# Patient Record
Sex: Male | Born: 1965 | Hispanic: No | Marital: Married
Health system: Southern US, Community
[De-identification: ages and names within clinical notes are randomized; demographics above are authoritative.]

---

## 2000-07-22 ENCOUNTER — Encounter: Payer: Self-pay | Admitting: Emergency Medicine

## 2000-07-22 ENCOUNTER — Emergency Department (HOSPITAL_COMMUNITY): Admission: EM | Admit: 2000-07-22 | Discharge: 2000-07-22 | Payer: Self-pay | Admitting: *Deleted

## 2010-02-23 ENCOUNTER — Emergency Department (HOSPITAL_COMMUNITY): Admission: EM | Admit: 2010-02-23 | Discharge: 2010-02-23 | Payer: Self-pay | Admitting: Emergency Medicine

## 2010-02-26 ENCOUNTER — Emergency Department (HOSPITAL_COMMUNITY): Admission: EM | Admit: 2010-02-26 | Discharge: 2010-02-26 | Payer: Self-pay | Admitting: Internal Medicine

## 2020-01-22 ENCOUNTER — Emergency Department (HOSPITAL_COMMUNITY): Payer: HRSA Program

## 2020-01-22 ENCOUNTER — Emergency Department (HOSPITAL_COMMUNITY)
Admission: EM | Admit: 2020-01-22 | Discharge: 2020-01-23 | Disposition: A | Discharge status: Home / Self Care | Payer: HRSA Program | Attending: Emergency Medicine | Admitting: Emergency Medicine

## 2020-01-22 ENCOUNTER — Encounter (HOSPITAL_COMMUNITY): Payer: Self-pay | Admitting: Emergency Medicine

## 2020-01-22 ENCOUNTER — Other Ambulatory Visit: Payer: Self-pay

## 2020-01-22 DIAGNOSIS — R519 Headache, unspecified: Secondary | ICD-10-CM | POA: Diagnosis present

## 2020-01-22 DIAGNOSIS — U071 COVID-19: Secondary | ICD-10-CM | POA: Diagnosis not present

## 2020-01-22 MED ORDER — ALBUTEROL SULFATE HFA 108 (90 BASE) MCG/ACT IN AERS
2.0000 | INHALATION_SPRAY | RESPIRATORY_TRACT | Status: DC | PRN
  Administered 2020-01-22: 2 via RESPIRATORY_TRACT
  Filled 2020-01-22: qty 6.7

## 2020-01-22 MED ORDER — AEROCHAMBER Z-STAT PLUS/MEDIUM MISC
1.0000 | Freq: Once | Status: AC
  Administered 2020-01-22: 1
  Filled 2020-01-22: qty 1

## 2020-01-22 MED ORDER — ACETAMINOPHEN 325 MG PO TABS
650.0000 mg | ORAL_TABLET | Freq: Once | ORAL | Status: AC | PRN
  Administered 2020-01-22: 650 mg via ORAL
  Filled 2020-01-22: qty 2

## 2020-01-22 NOTE — ED Provider Notes (Signed)
[facility]-EMERGENCY DEPT Provider Note   CSN: [id number] Arrival date & time: 01/22/20  1831     History Chief Complaint  Patient presents with  . Headache  . Cough    [facility] is a 54 y.o. male with a hx of no major medical problems presents to the Emergency Department complaining of gradual, persistent, progressively worsening headache and cough onset 6 days ago.  Pt reports his headache has improved spontaneously over the last few days, but his cough has worsened.  Pt reports no known sick or COVID contacts.  Pt reports he is not vaccinated.  Pt found to be febrile on triage and acetaminophen given at that time. Pt reports a hx of intermittent headaches for the last 4 years.  On Monday he was evaluated at [facility] for his headache and given Nurtec.  Pt reports this did not help his symptoms.  No other treatments PTA.  No specific aggravating or alleviating factors. Pt denies neck pain, neck stiffness, chest pain, abd pain, N/V/D, weakness, dizziness, syncope, dysuria.     The history is provided by the patient and medical records. No language interpreter was used.       History reviewed. No pertinent past medical history.  There are no problems to display for this patient.   History reviewed. No pertinent surgical history.     No family history on file.  Social History   Tobacco Use  . Smoking status: Not on file  Substance Use Topics  . Alcohol use: Not on file  . Drug use: Not on file    Home Medications Prior to Admission medications   Medication Sig Start Date End Date Taking? Authorizing Provider  albuterol (VENTOLIN HFA) 108 (90 Base) MCG/ACT inhaler Inhale 2 puffs into the lungs every 2 (two) hours as needed for wheezing or shortness of breath (cough). 01/23/20   Timouthy Gilardi, Dahlia Client, PA-C  Spacer/Aero-Holding Chambers (AEROCHAMBER PLUS WITH MASK) inhaler Use as instructed 01/23/20   Reann Dobias, Dahlia Client, PA-C    Allergies      Patient has no known allergies.  Review of Systems   Review of Systems  Constitutional: Positive for fatigue and fever. Negative for appetite change, diaphoresis and unexpected weight change.  HENT: Negative for mouth sores.   Eyes: Negative for visual disturbance.  Respiratory: Positive for cough and shortness of breath. Negative for chest tightness and wheezing.   Cardiovascular: Negative for chest pain.  Gastrointestinal: Negative for abdominal pain, constipation, diarrhea, nausea and vomiting.  Endocrine: Negative for polydipsia, polyphagia and polyuria.  Genitourinary: Negative for dysuria, frequency, hematuria and urgency.  Musculoskeletal: Positive for myalgias. Negative for back pain and neck stiffness.  Skin: Negative for rash.  Allergic/Immunologic: Negative for immunocompromised state.  Neurological: Positive for headaches. Negative for syncope and light-headedness.  Hematological: Does not bruise/bleed easily.  Psychiatric/Behavioral: Negative for sleep disturbance. The patient is not nervous/anxious.     Physical Exam Updated Vital Signs BP (!) 132/98 (BP Location: Left Arm)   Pulse (!) 116   Temp (!) 101.1 F (38.4 C) (Oral)   Resp 18   Ht 6\' 2"  (1.88 m)   Wt 95.3 kg   SpO2 96%   BMI 26.96 kg/m   Physical Exam Vitals and nursing note reviewed.  Constitutional:      General: He is not in acute distress.    Appearance: He is not diaphoretic.  HENT:     Head: Normocephalic.  Eyes:     General: No scleral icterus.  Conjunctiva/sclera: Conjunctivae normal.  Cardiovascular:     Rate and Rhythm: Regular rhythm. Tachycardia present.     Pulses: Normal pulses.          Radial pulses are 2+ on the right side and 2+ on the left side.     Comments: Mild tachycardia Pulmonary:     Effort: No tachypnea, accessory muscle usage, prolonged expiration, respiratory distress or retractions.     Breath sounds: No stridor. Wheezing ( throughout) and rhonchi present.      Comments: Equal chest rise. No increased work of breathing. Abdominal:     General: There is no distension.     Palpations: Abdomen is soft.     Tenderness: There is no abdominal tenderness. There is no guarding or rebound.  Musculoskeletal:     Cervical back: Normal range of motion.     Comments: Moves all extremities equally and without difficulty.  Skin:    General: Skin is warm and dry.     Capillary Refill: Capillary refill takes less than 2 seconds.  Neurological:     Mental Status: He is alert.     GCS: GCS eye subscore is 4. GCS verbal subscore is 5. GCS motor subscore is 6.     Comments: Speech is clear and goal oriented.  Psychiatric:        Mood and Affect: Mood normal.     ED Results / Procedures / Treatments   Labs (all labs ordered are listed, but only abnormal results are displayed) Labs Reviewed  SARS CORONAVIRUS 2 BY RT PCR (HOSPITAL ORDER, PERFORMED IN [facility]) - Abnormal; Notable for the following components:      Result Value   SARS Coronavirus 2 POSITIVE (*)    All other components within normal limits     Radiology DG Chest 2 View  Result Date: 01/22/2020 CLINICAL DATA:  Cough and headache for 4 days. EXAM: CHEST - 2 VIEW COMPARISON:  None. FINDINGS: The cardiomediastinal silhouette is unremarkable. Hazy/mild airspace opacity within the LOWER lobe on the LATERAL view may represent pneumonia. There is no evidence of pulmonary edema, suspicious pulmonary nodule/mass, pleural effusion, or pneumothorax. No acute bony abnormalities are identified. IMPRESSION: Hazy/mild airspace opacity within the LOWER lobe on the LATERAL view, question pneumonia. Radiographic follow-up to resolution is recommended. Electronically Signed   By: Harmon Pier M.D.   On: 01/22/2020 19:56    Procedures Procedures (including critical care time)  Medications Ordered in ED Medications  albuterol (VENTOLIN HFA) 108 (90 Base) MCG/ACT inhaler 2 puff (2 puffs Inhalation  Given 01/22/20 2319)  acetaminophen (TYLENOL) tablet 650 mg (650 mg Oral Given 01/22/20 1920)  aerochamber Z-Stat Plus/medium 1 each (1 each Other Given 01/22/20 2319)    ED Course  I have reviewed the triage vital signs and the nursing notes.  Pertinent labs & imaging results that were available during my care of the patient were reviewed by me and considered in my medical decision making (see chart for details).  Clinical Course as of Jan 22 446  Wynelle Link Jan 22, 2020  2221 Pt given Nurtec by Urgent Care on Monday but has not has relief.   [HM]    Clinical Course User Index [HM] Aquarius Latouche, Boyd Kerbs   MDM Rules/Calculators/A&P                           [facility] was evaluated in Emergency Department on 01/23/2020 for the symptoms described  in the history of present illness. He was evaluated in the context of the global COVID-19 pandemic, which necessitated consideration that the patient might be at risk for infection with the SARS-CoV-2 virus that causes COVID-19. Institutional protocols and algorithms that pertain to the evaluation of patients at risk for COVID-19 are in a state of rapid change based on information released by regulatory bodies including the CDC and federal and state organizations. These policies and algorithms were followed during the patient's care in the ED.  Pt presents with febrile illness, cough and headache.  Concerning for COVID-19.  No resp distress, hypoxia or tachypnea.  COVID test pending.  Some wheezing on exam, albuterol given here.  Patient Covid positive.  Improved wheezing after albuterol administration.  Patient remains without hypoxia or tachypnea.  Will be discharged home.  Patient was added to the monoclonal antibody list to see if he qualifies.  BP (!) 128/100 (BP Location: Right Arm)   Pulse 88   Temp 98.9 F (37.2 C) (Oral)   Resp 20   Ht 6\' 2"  (1.88 m)   Wt 95.3 kg   SpO2 98%   BMI 26.96 kg/m    Final Clinical Impression(s) /  ED Diagnoses Final diagnoses:  COVID-19    Rx / DC Orders ED Discharge Orders         Ordered    albuterol (VENTOLIN HFA) 108 (90 Base) MCG/ACT inhaler  Every 2 hours PRN        01/23/20 0230    Spacer/Aero-Holding Chambers (AEROCHAMBER PLUS WITH MASK) inhaler        01/23/20 0230           Rhea Thrun, 01/25/20, PA-C 01/23/20 01/25/20    [id number], MD 01/24/20 1248

## 2020-01-22 NOTE — ED Triage Notes (Signed)
Pt coughing and headache x 4 days. Pt also had congestion. Denies being exposed to Covid.

## 2020-01-23 ENCOUNTER — Telehealth: Payer: Self-pay | Admitting: Unknown Physician Specialty

## 2020-01-23 ENCOUNTER — Ambulatory Visit (HOSPITAL_COMMUNITY)
Admission: RE | Admit: 2020-01-23 | Discharge: 2020-01-23 | Disposition: A | Discharge status: Home / Self Care | Payer: HRSA Program | Source: Ambulatory Visit | Attending: Pulmonary Disease | Admitting: Pulmonary Disease

## 2020-01-23 ENCOUNTER — Other Ambulatory Visit: Payer: Self-pay | Admitting: Unknown Physician Specialty

## 2020-01-23 DIAGNOSIS — E663 Overweight: Secondary | ICD-10-CM

## 2020-01-23 DIAGNOSIS — U071 COVID-19: Secondary | ICD-10-CM | POA: Insufficient documentation

## 2020-01-23 LAB — SARS CORONAVIRUS 2 BY RT PCR (HOSPITAL ORDER, PERFORMED IN ~~LOC~~ HOSPITAL LAB): SARS Coronavirus 2: POSITIVE — AB

## 2020-01-23 MED ORDER — SODIUM CHLORIDE 0.9 % IV SOLN: INTRAVENOUS | Status: DC | PRN

## 2020-01-23 MED ORDER — METHYLPREDNISOLONE SODIUM SUCC 125 MG IJ SOLR: 125.0000 mg | Freq: Once | INTRAMUSCULAR | Status: DC | PRN

## 2020-01-23 MED ORDER — ALBUTEROL SULFATE HFA 108 (90 BASE) MCG/ACT IN AERS: 2.0000 | INHALATION_SPRAY | Freq: Once | RESPIRATORY_TRACT | Status: DC | PRN

## 2020-01-23 MED ORDER — DIPHENHYDRAMINE HCL 50 MG/ML IJ SOLN: 50.0000 mg | Freq: Once | INTRAMUSCULAR | Status: DC | PRN

## 2020-01-23 MED ORDER — AEROCHAMBER PLUS FLO-VU MISC: 2 refills | Status: AC

## 2020-01-23 MED ORDER — FAMOTIDINE IN NACL 20-0.9 MG/50ML-% IV SOLN: 20.0000 mg | Freq: Once | INTRAVENOUS | Status: DC | PRN

## 2020-01-23 MED ORDER — ALBUTEROL SULFATE HFA 108 (90 BASE) MCG/ACT IN AERS: 2.0000 | INHALATION_SPRAY | RESPIRATORY_TRACT | 0 refills | Status: AC | PRN

## 2020-01-23 MED ORDER — SODIUM CHLORIDE 0.9 % IV SOLN
1200.0000 mg | Freq: Once | INTRAVENOUS | Status: AC
  Administered 2020-01-23: 1200 mg via INTRAVENOUS
  Filled 2020-01-23: qty 10

## 2020-01-23 MED ORDER — EPINEPHRINE 0.3 MG/0.3ML IJ SOAJ: 0.3000 mg | Freq: Once | INTRAMUSCULAR | Status: DC | PRN

## 2020-01-23 NOTE — Discharge Instructions (Signed)

## 2020-01-23 NOTE — Telephone Encounter (Signed)
I connected by phone with [facility] Jacobs on 01/23/2020 at 8:33 AM to discuss the potential use of a new treatment for mild to moderate COVID-19 viral infection in non-hospitalized patients.  This patient is a 54 y.o. male that meets the FDA criteria for Emergency Use Authorization of COVID monoclonal antibody casirivimab/imdevimab.  Has a (+) direct SARS-CoV-2 viral test result  Has mild or moderate COVID-19   Is NOT hospitalized due to COVID-19  Is within 10 days of symptom onset  Has at least one of the high risk factor(s) for progression to severe COVID-19 and/or hospitalization as defined in EUA.  Specific high risk criteria : BMI > 25   I have spoken and communicated the following to the patient or parent/caregiver regarding COVID monoclonal antibody treatment:  1. FDA has authorized the emergency use for the treatment of mild to moderate COVID-19 in adults and pediatric patients with positive results of direct SARS-CoV-2 viral testing who are 9 years of age and older weighing at least 40 kg, and who are at high risk for progressing to severe COVID-19 and/or hospitalization.  2. The significant known and potential risks and benefits of COVID monoclonal antibody, and the extent to which such potential risks and benefits are unknown.  3. Information on available alternative treatments and the risks and benefits of those alternatives, including clinical trials.  4. Patients treated with COVID monoclonal antibody should continue to self-isolate and use infection control measures (e.g., wear mask, isolate, social distance, avoid sharing personal items, clean and disinfect "high touch" surfaces, and frequent handwashing) according to CDC guidelines.   5. The patient or parent/caregiver has the option to accept or refuse COVID monoclonal antibody treatment.  After reviewing this information with the patient, The patient agreed to proceed with receiving casirivimab\imdevimab infusion and  will be provided a copy of the Fact sheet prior to receiving the infusion. Gabriel Cirri 01/23/2020 8:33 AM Sx onset 8/17

## 2020-01-23 NOTE — Progress Notes (Signed)
  Diagnosis: COVID-19  Physician:DR Delford Field  Procedure: [facility] Infusion [facility] Med: casirivimab\imdevimab infusion - Provided patient with casirivimab\imdevimab fact sheet for patients, parents and caregivers prior to infusion.  Complications: No immediate complications noted.  Discharge: Discharged home   Audrie Gallus 01/23/2020

## 2020-01-23 NOTE — Discharge Instructions (Signed)
1. Medications: Alternate tylenol and ibuprofen for fever control, continue usual home medications 2. Treatment: rest, drink plenty of fluids, isolate for the next 10 days 3. Follow Up: Please followup with your primary doctor if your symptoms are not improving after 10-14 days; Please return to the ER for high fevers, persistent vomiting, shortness of breath or other concerns.  

## 2021-03-24 IMAGING — CR DG CHEST 2V
2 series · 2 of 2 positions shown · non-contrast
Comparison: None.

CLINICAL DATA: Cough and headache for 4 days.

EXAM:
CHEST - 2 VIEW

[w chest pa]
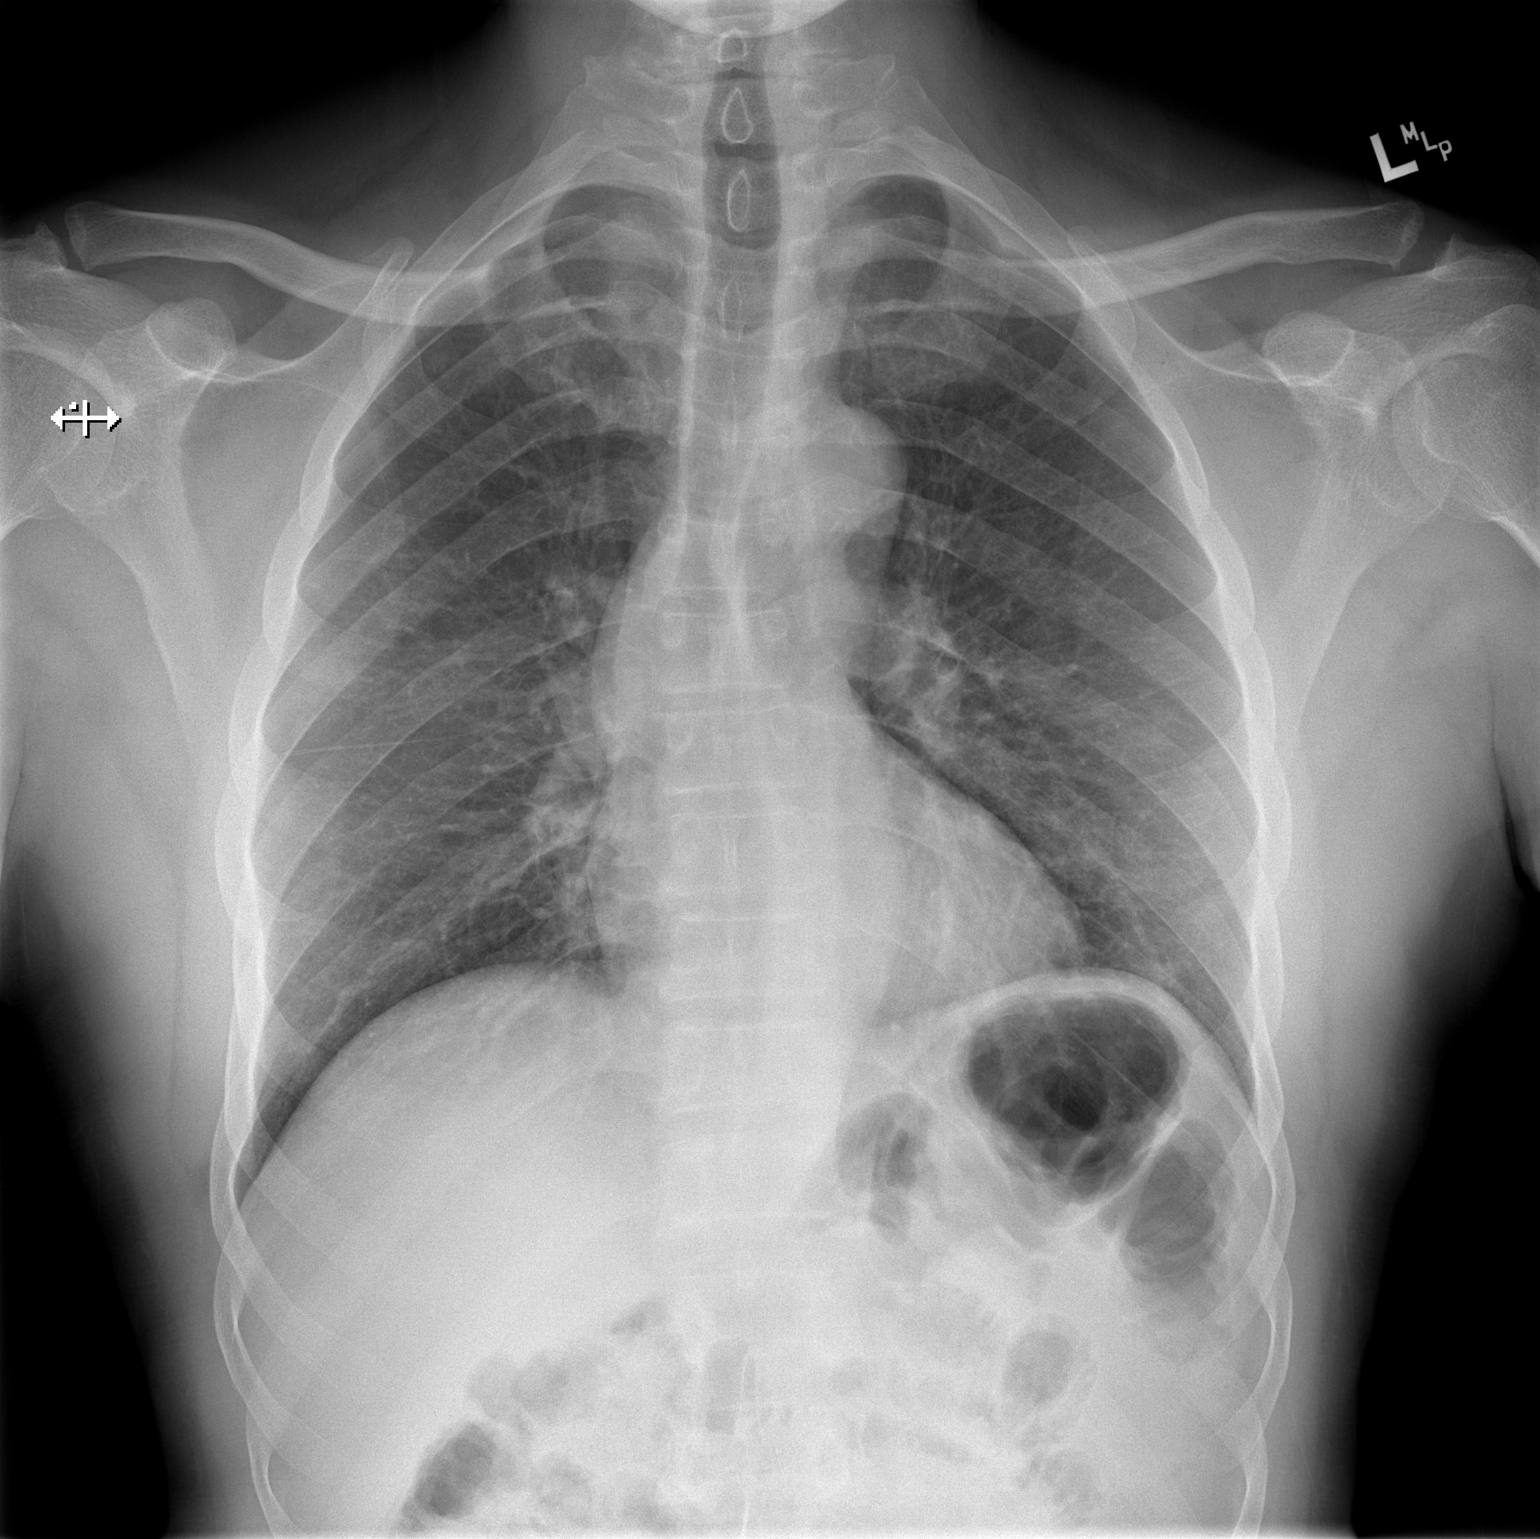

[w chest lat]
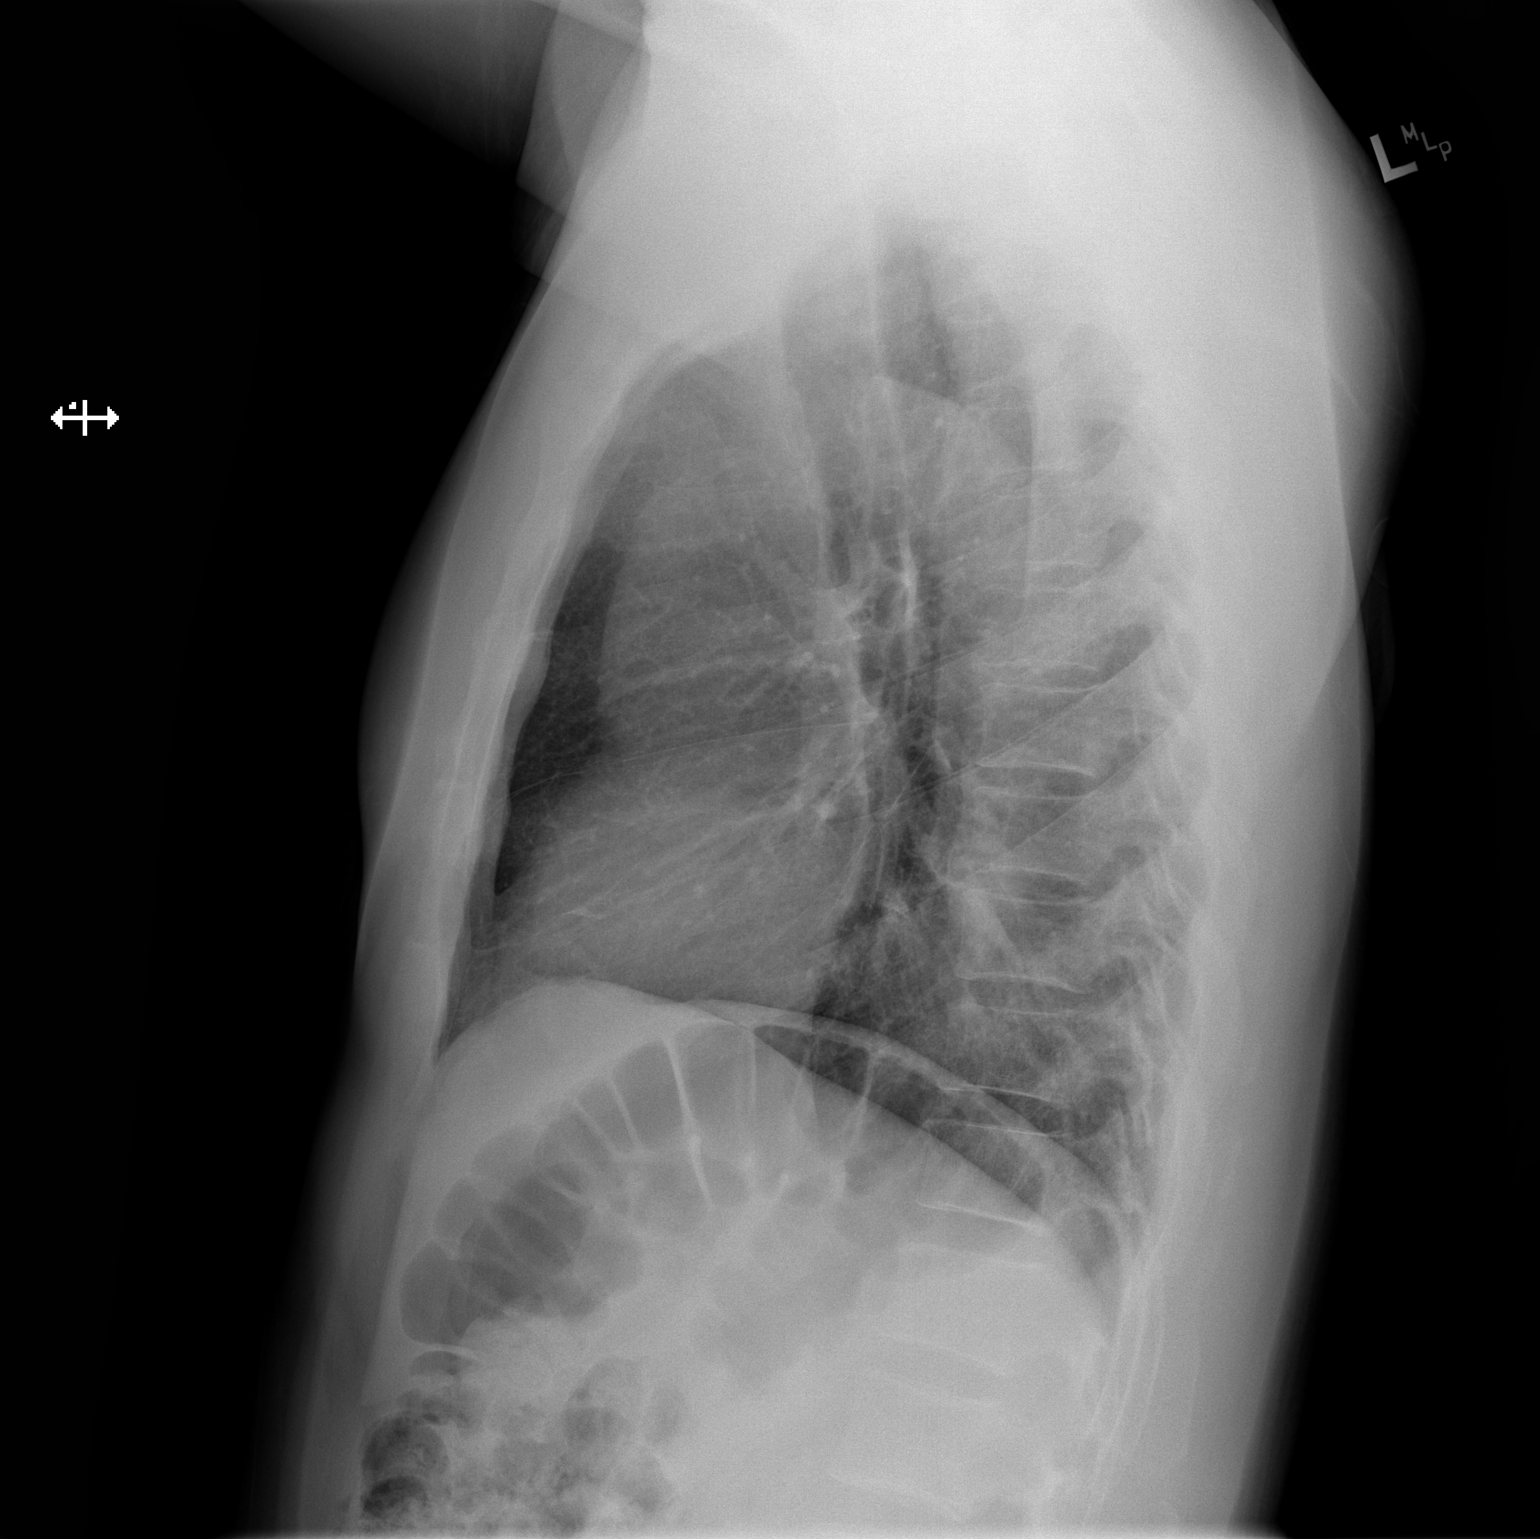

[2 of 2 positions shown; findings below may reference images not displayed]

FINDINGS: The cardiomediastinal silhouette is unremarkable.

Hazy/mild airspace opacity within the LOWER lobe on the LATERAL view
may represent pneumonia.

There is no evidence of pulmonary edema, suspicious pulmonary
nodule/mass, pleural effusion, or pneumothorax.

No acute bony abnormalities are identified.
IMPRESSION: Hazy/mild airspace opacity within the LOWER lobe on the LATERAL
view, question pneumonia. Radiographic follow-up to resolution is
recommended.

## 2023-10-12 ENCOUNTER — Encounter: Payer: Self-pay | Admitting: Internal Medicine
